# Patient Record
Sex: Male | Born: 1990 | Race: White | Hispanic: No | Marital: Married | Smoking: Never smoker
Health system: Southern US, Community
[De-identification: ages and names within clinical notes are randomized; demographics above are authoritative.]

## PROBLEM LIST (undated history)

## (undated) DIAGNOSIS — J45909 Unspecified asthma, uncomplicated: Secondary | ICD-10-CM

---

## 2020-12-28 ENCOUNTER — Other Ambulatory Visit: Payer: Self-pay

## 2020-12-28 ENCOUNTER — Ambulatory Visit: Payer: 59 | Admitting: Gastroenterology

## 2020-12-28 ENCOUNTER — Encounter: Payer: Self-pay | Admitting: Gastroenterology

## 2020-12-28 VITALS — BP 108/71 | HR 76 | Temp 97.8°F | Ht 68.0 in | Wt 147.0 lb

## 2020-12-28 DIAGNOSIS — Z8719 Personal history of other diseases of the digestive system: Secondary | ICD-10-CM

## 2020-12-28 DIAGNOSIS — K219 Gastro-esophageal reflux disease without esophagitis: Secondary | ICD-10-CM

## 2020-12-28 DIAGNOSIS — R1310 Dysphagia, unspecified: Secondary | ICD-10-CM

## 2020-12-28 MED ORDER — OMEPRAZOLE 40 MG PO CPDR: 40.0000 mg | DELAYED_RELEASE_CAPSULE | Freq: Two times a day (BID) | ORAL | 3 refills | Status: DC

## 2020-12-28 NOTE — Progress Notes (Signed)
Wyline Mood MD, MRCP(U.K) [address]  [address]  [place], [place] [address]  Main: [phone]  Fax: [phone]   Gastroenterology Consultation  Referring Provider:    Self    Primary Care Physician:  Pcp, No Primary Gastroenterologist:  Dr. Wyline Mood  Reason for Consultation:     GERD        HPI:   Walter Vasquez is a 30 y.o. y/o male is here today to see me for acid reflux.  He was accompanied by his wife.  He is a Public affairs consultant himself.  He states that he has had acid reflux since 2013.  In 2019 he underwent an EGD and I reviewed the report myself and it suggests he has a hiatal hernia but otherwise was normal.  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  Pictures of his last endoscopy report indicate a normal-appearing pylorus based on what I could interpret.  He does have a history suggestive of early satiety and feeling that food sits in the stomach for a long period of time.  When he lays flat he has feels food regurgitates into his throat even a few hours after his last meal which is usually around 5 PM.  Denies any exposure to opiates or marijuana.  He has been on famotidine 40 mg twice a day with breakthrough symptoms occurring a few times a week for which she takes Tums.  He also has had a history of dysphagia to solids at times.   Some weight loss partly intentional.  He uses a wedge pillow at night. No past medical history on file.    Prior to Admission medications   Not on File    No family history on file.   Social History   Tobacco Use   Smoking status: Never    Passive exposure: Never   Smokeless tobacco: Never  Substance Use Topics   Alcohol use: Yes    Comment: Rarely    Allergies as of 12/28/2020   (No Known Allergies)    Review of Systems:    All systems reviewed and negative except where noted in HPI.   Physical Exam:  BP 108/71   Pulse 76   Temp 97.8 F (36.6 C) (Oral)   Ht 5'  8" (1.727 m)   Wt 147 lb (66.7 kg)   BMI 22.35 kg/m  No LMP for male patient. Psych:  Alert and cooperative. Normal mood and affect. General:   Alert,  Well-developed, well-nourished, pleasant and cooperative in NAD Head:  Normocephalic and atraumatic. Eyes:  Sclera clear, no icterus.   Conjunctiva pink. Ears:  Normal auditory acuity. Nose:  No deformity, discharge, or lesions. Lungs:  Respirations even and unlabored.  Clear throughout to auscultation.   No wheezes, crackles, or rhonchi. No acute distress. Heart:  Regular rate and rhythm; no murmurs, clicks, rubs, or gallops. Abdomen:  Normal bowel sounds.  No bruits.  Soft, non-tender and non-distended without masses, hepatosplenomegaly or hernias noted.  No guarding or rebound tenderness.    Neurologic:  Alert and oriented x3;  grossly normal neurologically. Psych:  Alert and cooperative. Normal mood and affect.  Imaging Studies: No results found.  Assessment and Plan:   Christpher Stogsdill is a 30 y.o. 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  Presently has features suggestive of gastroparesis versus gastric outlet obstruction, acid reflux symptoms not well controlled on famotidine 40 mg twice daily.  Also some gas and bloating.  Plan 1.  Continue lifestyle changes.  Stop famotidine commence on Prilosec 40 mg twice daily to see if he would respond to maximum acid suppression. 2.  EGD to rule out eosinophilic esophagitis as well as gastric outlet obstruction and evaluate pylorus to rule out stenosis. 3.  Down the road can consider esophageal manometry, pH testing, gastric emptying study. 4.  Down the road may need to consider treatment for hiatal hernia. 5.  Low FODMAP diet to treat gas and bloating. 6.  He will update me in a week's time about his symptoms.  I have  discussed alternative options, risks & benefits,  which include, but are not limited to, bleeding, infection, perforation,respiratory complication & drug reaction.  The patient agrees with this plan & written consent will be obtained.     Follow up in 12 weeks.   Dr Wyline Mood MD,MRCP(U.K)

## 2020-12-28 NOTE — Patient Instructions (Addendum)
°Food Choices for Gastroesophageal Reflux Disease, Adult °When you have gastroesophageal reflux disease (GERD), the foods you eat and your eating habits are very important. Choosing the right foods can help ease your discomfort. Think about working with a food expert (dietitian) to help you make good choices. °What are tips for following this plan? °Reading food labels °Look for foods that are low in saturated fat. Foods that may help with your symptoms include: °Foods that have less than 5% of daily value (DV) of fat. °Foods that have 0 grams of trans fat. °Cooking °Do not fry your food. °Cook your food by baking, steaming, grilling, or broiling. These are all methods that do not need a lot of fat for cooking. °To add flavor, try to use herbs that are low in spice and acidity. °Meal planning ° °Choose healthy foods that are low in fat, such as: °Fruits and vegetables. °Whole grains. °Low-fat dairy products. °Lean meats, fish, and poultry. °Eat small meals often instead of eating 3 large meals each day. Eat your meals slowly in a place where you are relaxed. Avoid bending over or lying down until 2-3 hours after eating. °Limit high-fat foods such as fatty meats or fried foods. °Limit your intake of fatty foods, such as oils, butter, and shortening. °Avoid the following as told by your doctor: °Foods that cause symptoms. These may be different for different people. Keep a food diary to keep track of foods that cause symptoms. °Alcohol. °Drinking a lot of liquid with meals. °Eating meals during the 2-3 hours before bed. °Lifestyle °Stay at a healthy weight. Ask your doctor what weight is healthy for you. If you need to lose weight, work with your doctor to do so safely. °Exercise for at least 30 minutes on 5 or more days each week, or as told by your doctor. °Wear loose-fitting clothes. °Do not smoke or use any products that contain nicotine or tobacco. If you need help quitting, ask your doctor. °Sleep with the head  of your bed higher than your feet. Use a wedge under the mattress or blocks under the bed frame to raise the head of the bed. °Chew sugar-free gum after meals. °What foods should eat? °Eat a healthy, well-balanced diet of fruits, vegetables, whole grains, low-fat dairy products, lean meats, fish, and poultry. Each person is different. °Foods that may cause symptoms in one person may not cause any symptoms in another person. Work with your doctor to find foods that are safe for you. °The items listed above may not be a complete list of what you can eat and drink. Contact a food expert for more options. °What foods should I avoid? °Limiting some of these foods may help in managing the symptoms of GERD. Everyone is different. Talk with a food expert or your doctor to help you find the exact foods to avoid, if any. °Fruits °Any fruits prepared with added fat. Any fruits that cause symptoms. For some people, this may include citrus fruits, such as oranges, grapefruit, pineapple, and lemons. °Vegetables °Deep-fried vegetables. French fries. Any vegetables prepared with added fat. Any vegetables that cause symptoms. For some people, this may include tomatoes and tomato products, chili peppers, onions and garlic, and horseradish. °Grains °Pastries or quick breads with added fat. °Meats and other proteins °High-fat meats, such as fatty beef or pork, hot dogs, ribs, ham, sausage, salami, and bacon. Fried meat or protein, including fried fish and fried chicken. Nuts and nut butters, in large amounts. °Dairy °Whole milk   and chocolate milk. Sour cream. Cream. Ice cream. Cream cheese. Milkshakes. °Fats and oils °Butter. Margarine. Shortening. Ghee. °Beverages °Coffee and tea, with or without caffeine. Carbonated beverages. Sodas. Energy drinks. Fruit juice made with acidic fruits, such as orange or grapefruit. Tomato juice. Alcoholic drinks. °Sweets and desserts °Chocolate and cocoa. Donuts. °Seasonings and condiments °Pepper.  Peppermint and spearmint. Added salt. Any condiments, herbs, or seasonings that cause symptoms. For some people, this may include curry, hot sauce, or vinegar-based salad dressings. °The items listed above may not be a complete list of what you should not eat and drink. Contact a food expert for more options. °Questions to ask your doctor °Diet and lifestyle changes are often the first steps that are taken to manage symptoms of GERD. If diet and lifestyle changes do not help, talk with your doctor about taking medicines. °Where to find more information °International Foundation for Gastrointestinal Disorders: aboutgerd.org °Summary °When you have GERD, food and lifestyle choices are very important in easing your symptoms. °Eat small meals often instead of 3 large meals a day. Eat your meals slowly and in a place where you are relaxed. °Avoid bending over or lying down until 2-3 hours after eating. °Limit high-fat foods such as fatty meats or fried foods. °This information is not intended to replace advice given to you by your health care provider. Make sure you discuss any questions you have with your health care provider. °Document Revised: 12/01/2019 Document Reviewed: 12/01/2019 °Elsevier Patient Education © 2022 Elsevier Inc[employer] °Low-FODMAP Eating Plan °FODMAP stands for fermentable oligosaccharides, disaccharides, monosaccharides, and polyols. These are sugars that are hard for some people to digest. A low-FODMAP eating plan may help some people who have irritable bowel syndrome (IBS) and certain other bowel (intestinal) diseases to manage their symptoms. °This meal plan can be complicated to follow. Work with a diet and nutrition specialist (dietitian) to make a low-FODMAP eating plan that is right for you. A dietitian can help make sure that you get enough nutrition from this diet. °What are tips for following this plan? °Reading food labels °Check labels for hidden FODMAPs such as: °High-fructose  syrup. °Honey. °Agave. °Natural fruit flavors. °Onion or garlic powder. °Choose low-FODMAP foods that contain 3-4 grams of fiber per serving. °Check food labels for serving sizes. Eat only one serving at a time to make sure FODMAP levels stay low. °Shopping °Shop with a list of foods that are recommended on this diet and make a meal plan. °Meal planning °Follow a low-FODMAP eating plan for up to 6 weeks, or as told by your health care provider or dietitian. °To follow the eating plan: °Eliminate high-FODMAP foods from your diet completely. Choose only low-FODMAP foods to eat. You will do this for 2-6 weeks. °Gradually reintroduce high-FODMAP foods into your diet one at a time. Most people should wait a few days before introducing the next new high-FODMAP food into their meal plan. Your dietitian can recommend how quickly you may reintroduce foods. °Keep a daily record of what and how much you eat and drink. Make note of any symptoms that you have after eating. °Review your daily record with a dietitian regularly to identify which foods you can eat and which foods you should avoid. °General tips °Drink enough fluid each day to keep your urine pale yellow. °Avoid processed foods. These often have added sugar and may be high in FODMAPs. °Avoid most dairy products, whole grains, and sweeteners. °Work with a dietitian to make sure you get enough   fiber in your diet. °Avoid high FODMAP foods at meals to manage symptoms. °Recommended foods °Fruits °Bananas, oranges, tangerines, lemons, limes, blueberries, raspberries, strawberries, grapes, cantaloupe, honeydew melon, kiwi, papaya, passion fruit, and pineapple. Limited amounts of dried cranberries, banana chips, and shredded coconut. °Vegetables °Eggplant, zucchini, cucumber, peppers, green beans, bean sprouts, lettuce, arugula, kale, Swiss chard, spinach, collard greens, bok choy, summer squash, potato, and tomato. Limited amounts of corn, carrot, and sweet potato. Green  parts of scallions. °Grains °Gluten-free grains, such as rice, oats, buckwheat, quinoa, corn, polenta, and millet. Gluten-free pasta, bread, or cereal. Rice noodles. Corn tortillas. °Meats and other proteins °Unseasoned beef, pork, poultry, or fish. Eggs. Bacon. Tofu (firm) and tempeh. Limited amounts of nuts and seeds, such as almonds, walnuts, brazil nuts, pecans, peanuts, nut butters, pumpkin seeds, chia seeds, and sunflower seeds. °Dairy °Lactose-free milk, yogurt, and kefir. Lactose-free cottage cheese and ice cream. Non-dairy milks, such as almond, coconut, hemp, and rice milk. Non-dairy yogurt. Limited amounts of goat cheese, brie, mozzarella, parmesan, swiss, and other hard cheeses. °Fats and oils °Butter-free spreads. Vegetable oils, such as olive, canola, and sunflower oil. °Seasoning and other foods °Artificial sweeteners with names that do not end in "ol," such as aspartame, saccharine, and stevia. Maple syrup, white table sugar, raw sugar, brown sugar, and molasses. Mayonnaise, soy sauce, and tamari. Fresh basil, coriander, parsley, rosemary, and thyme. °Beverages °Water and mineral water. Sugar-sweetened soft drinks. Small amounts of orange juice or cranberry juice. Black and green tea. Most dry wines. Coffee. °The items listed above may not be a complete list of foods and beverages you can eat. Contact a dietitian for more information. °Foods to avoid °Fruits °Fresh, dried, and juiced forms of apple, pear, watermelon, peach, plum, cherries, apricots, blackberries, boysenberries, figs, nectarines, and mango. Avocado. °Vegetables °Chicory root, artichoke, asparagus, cabbage, snow peas, Brussels sprouts, broccoli, sugar snap peas, mushrooms, celery, and cauliflower. Onions, garlic, leeks, and the white part of scallions. °Grains °Wheat, including kamut, durum, and semolina. Barley and bulgur. Couscous. Wheat-based cereals. Wheat noodles, bread, crackers, and pastries. °Meats and other proteins °Fried or  fatty meat. Sausage. Cashews and pistachios. Soybeans, baked beans, black beans, chickpeas, kidney beans, fava beans, navy beans, lentils, black-eyed peas, and split peas. °Dairy °Milk, yogurt, ice cream, and soft cheese. Cream and sour cream. Milk-based sauces. Custard. Buttermilk. Soy milk. °Seasoning and other foods °Any sugar-free gum or candy. Foods that contain artificial sweeteners such as sorbitol, mannitol, isomalt, or xylitol. Foods that contain honey, high-fructose corn syrup, or agave. Bouillon, vegetable stock, beef stock, and chicken stock. Garlic and onion powder. Condiments made with onion, such as hummus, chutney, pickles, relish, salad dressing, and salsa. Tomato paste. °Beverages °Chicory-based drinks. Coffee substitutes. Chamomile tea. Fennel tea. Sweet or fortified wines such as port or sherry. Diet soft drinks made with isomalt, mannitol, maltitol, sorbitol, or xylitol. Apple, pear, and mango juice. Juices with high-fructose corn syrup. °The items listed above may not be a complete list of foods and beverages you should avoid. Contact a dietitian for more information. °Summary °FODMAP stands for fermentable oligosaccharides, disaccharides, monosaccharides, and polyols. These are sugars that are hard for some people to digest. °A low-FODMAP eating plan is a short-term diet that helps to ease symptoms of certain bowel diseases. °The eating plan usually lasts up to 6 weeks. After that, high-FODMAP foods are reintroduced gradually and one at a time. This can help you find out which foods may be causing symptoms. °A low-FODMAP eating plan can be complicated.   It is best to work with a dietitian who has experience with this type of plan. °This information is not intended to replace advice given to you by your health care provider. Make sure you discuss any questions you have with your health care provider. °Document Revised: 10/09/2019 Document Reviewed: 10/09/2019 °Elsevier Patient Education © 2022  Elsevier Inc. ° °

## 2021-01-04 ENCOUNTER — Encounter: Payer: Self-pay | Admitting: Gastroenterology

## 2021-01-04 ENCOUNTER — Ambulatory Visit
Admission: RE | Admit: 2021-01-04 | Discharge: 2021-01-04 | Disposition: A | Discharge status: Home / Self Care | Payer: 59 | Attending: Gastroenterology | Admitting: Gastroenterology

## 2021-01-04 ENCOUNTER — Ambulatory Visit: Payer: 59 | Admitting: Anesthesiology

## 2021-01-04 ENCOUNTER — Encounter
Admission: RE | Disposition: A | Discharge status: Home / Self Care | Payer: Self-pay | Source: Home / Self Care | Attending: Gastroenterology

## 2021-01-04 ENCOUNTER — Other Ambulatory Visit: Payer: Self-pay

## 2021-01-04 DIAGNOSIS — K219 Gastro-esophageal reflux disease without esophagitis: Secondary | ICD-10-CM | POA: Insufficient documentation

## 2021-01-04 DIAGNOSIS — Z79899 Other long term (current) drug therapy: Secondary | ICD-10-CM | POA: Diagnosis not present

## 2021-01-04 DIAGNOSIS — R1310 Dysphagia, unspecified: Secondary | ICD-10-CM | POA: Diagnosis not present

## 2021-01-04 HISTORY — PX: ESOPHAGOGASTRODUODENOSCOPY (EGD) WITH PROPOFOL: SHX5813

## 2021-01-04 SURGERY — ESOPHAGOGASTRODUODENOSCOPY (EGD) WITH PROPOFOL
Anesthesia: General

## 2021-01-04 MED ORDER — PROPOFOL 10 MG/ML IV BOLUS
INTRAVENOUS | Status: DC | PRN
  Administered 2021-01-04: 80 mg via INTRAVENOUS
  Administered 2021-01-04: 20 mg via INTRAVENOUS

## 2021-01-04 MED ORDER — SODIUM CHLORIDE 0.9 % IV SOLN: INTRAVENOUS | Status: DC

## 2021-01-04 MED ORDER — PHENYLEPHRINE HCL (PRESSORS) 10 MG/ML IV SOLN
INTRAVENOUS | Status: AC
  Filled 2021-01-04: qty 1

## 2021-01-04 MED ORDER — PROPOFOL 500 MG/50ML IV EMUL
INTRAVENOUS | Status: DC | PRN
  Administered 2021-01-04: 200 ug/kg/min via INTRAVENOUS

## 2021-01-04 MED ORDER — PROPOFOL 500 MG/50ML IV EMUL
INTRAVENOUS | Status: AC
  Filled 2021-01-04: qty 50

## 2021-01-04 NOTE — Transfer of Care (Signed)
Immediate Anesthesia Transfer of Care Note  Patient: Walter Vasquez  Procedure(s) Performed: ESOPHAGOGASTRODUODENOSCOPY (EGD) WITH PROPOFOL  Patient Location: PACU  Anesthesia Type:General  Level of Consciousness: sedated  Airway & Oxygen Therapy: Patient Spontanous Breathing and Patient connected to nasal cannula oxygen  Post-op Assessment: Report given to RN and Post -op Vital signs reviewed and stable  Post vital signs: Reviewed and stable  Last Vitals:  Vitals Value Taken Time  BP 98/67 01/04/21 0802  Temp    Pulse 72 01/04/21 0802  Resp 20 01/04/21 0802  SpO2 99 % 01/04/21 0802  Vitals shown include unvalidated device data.  Last Pain:  Vitals:   01/04/21 0654  TempSrc: Temporal  PainSc: 0-No pain         Complications: No notable events documented.

## 2021-01-04 NOTE — Op Note (Signed)
[facility] Gastroenterology Patient Name: Walter Vasquez Procedure Date: 01/04/2021 7:01 AM MRN: [id number] Account #: [id number] Date of Birth: 08-Jul-1990 Admit Type: Outpatient Age: 30 Room: [facility] ENDO ROOM 2 Gender: Male Note Status: Finalized Procedure:             Upper GI endoscopy Indications:           Heartburn, Suspected gastro-esophageal reflux disease Providers:             Wyline Mood MD, MD Referring MD:          No Local Md, MD (Referring MD) Medicines:             Monitored Anesthesia Care Complications:         No immediate complications. Procedure:             Pre-Anesthesia Assessment:                        - Prior to the procedure, a History and Physical was                         performed, and patient medications, allergies and                         sensitivities were reviewed. The patient's tolerance                         of previous anesthesia was reviewed.                        - The risks and benefits of the procedure and the                         sedation options and risks were discussed with the                         patient. All questions were answered and informed                         consent was obtained.                        - ASA Grade Assessment: II - A patient with mild                         systemic disease.                        After obtaining informed consent, the endoscope was                         passed under direct vision. Throughout the procedure,                         the patient's blood pressure, pulse, and oxygen                         saturations were monitored continuously. The Endoscope                         was introduced through  the mouth, and advanced to the                         third part of duodenum. The upper GI endoscopy was                         accomplished with ease. The patient tolerated the                         procedure well. Findings:      The examined esophagus was  normal. Biopsies were taken with a cold       forceps for histology.      The entire examined stomach was normal.      The cardia and gastric fundus were normal on retroflexion.      The examined duodenum was normal. Impression:            - Normal esophagus. Biopsied.                        - Normal stomach. Recommendation:        - Discharge patient to home (with escort).                        - Resume previous diet.                        - Continue present medications.                        - Await pathology results.                        - Return to my office as previously scheduled. Procedure Code(s):     --- Professional ---                        [phone], Esophagogastroduodenoscopy, flexible,                         transoral; with biopsy, single or multiple Diagnosis Code(s):     --- Professional ---                        R12, Heartburn CPT copyright 2019 American Medical Association. All rights reserved. The codes documented in this report are preliminary and upon coder review may  be revised to meet current compliance requirements. Wyline Mood, MD Wyline Mood MD, MD 01/04/2021 7:58:56 AM This report has been signed electronically. Number of Addenda: 0 Note Initiated On: 01/04/2021 7:01 AM Estimated Blood Loss:  Estimated blood loss: none. Estimated blood loss: none.      [facility]

## 2021-01-04 NOTE — H&P (Signed)
     Wyline Mood, MD [address], [place], [place], [address] [address], [address], [place], [place], [address] Phone: [phone]  Fax: [phone]  Primary Care Physician:  Pcp, No   Pre-Procedure History & Physical: HPI:  Wilson Dusenbery is a 30 y.o. male is here for an endoscopy    History reviewed. No pertinent past medical history.  History reviewed. No pertinent surgical history.  Prior to Admission medications   Medication Sig Start Date End Date Taking? Authorizing Provider  omeprazole (PRILOSEC) 40 MG capsule Take 1 capsule (40 mg total) by mouth in the morning and at bedtime. 12/28/20  Yes Wyline Mood, MD  famotidine (PEPCID) 40 MG tablet Take 40 mg by mouth 2 (two) times daily.    [provider]    Allergies as of 12/28/2020   (No Known Allergies)    History reviewed. No pertinent family history.  Social History   Socioeconomic History   Marital status: Married    Spouse name: Not on file   Number of children: Not on file   Years of education: Not on file   Highest education level: Not on file  Occupational History   Not on file  Tobacco Use   Smoking status: Never    Passive exposure: Never   Smokeless tobacco: Never  Vaping Use   Vaping Use: Never used  Substance and Sexual Activity   Alcohol use: Yes    Comment: Rarely   Drug use: Never   Sexual activity: Not on file  Other Topics Concern   Not on file  Social History Narrative   Not on file   Social Determinants of Health   Financial Resource Strain: Not on file  Food Insecurity: Not on file  Transportation Needs: Not on file  Physical Activity: Not on file  Stress: Not on file  Social Connections: Not on file  Intimate Partner Violence: Not on file    Review of Systems: See HPI, otherwise negative ROS  Physical Exam: BP 106/74   Pulse 69   Temp (!) 97.5 F (36.4 C) (Temporal)   Resp 17   Ht 5\' 8"  (1.727 m)   Wt 65.8 kg   SpO2 100%   BMI 22.05 kg/m   General:   Alert,  pleasant and cooperative in NAD Head:  Normocephalic and atraumatic. Neck:  Supple; no masses or thyromegaly. Lungs:  Clear throughout to auscultation, normal respiratory effort.    Heart:  +S1, +S2, Regular rate and rhythm, No edema. Abdomen:  Soft, nontender and nondistended. Normal bowel sounds, without guarding, and without rebound.   Neurologic:  Alert and  oriented x4;  grossly normal neurologically.  Impression/Plan: Mako Pelfrey is here for an endoscopy  to be performed for  evaluation of GERD    Risks, benefits, limitations, and alternatives regarding endoscopy have been reviewed with the patient.  Questions have been answered.  All parties agreeable.   Areta Haber, MD  01/04/2021, 7:46 AM GERD

## 2021-01-04 NOTE — Anesthesia Postprocedure Evaluation (Signed)
Anesthesia Post Note  Patient: Walter Vasquez  Procedure(s) Performed: ESOPHAGOGASTRODUODENOSCOPY (EGD) WITH PROPOFOL  Patient location during evaluation: Endoscopy Anesthesia Type: General Level of consciousness: awake and alert Pain management: pain level controlled Vital Signs Assessment: post-procedure vital signs reviewed and stable Respiratory status: spontaneous breathing, nonlabored ventilation, respiratory function stable and patient connected to nasal cannula oxygen Cardiovascular status: blood pressure returned to baseline and stable Postop Assessment: no apparent nausea or vomiting Anesthetic complications: no   No notable events documented.   Last Vitals:  Vitals:   01/04/21 0812 01/04/21 0822  BP: 107/79 106/76  Pulse: 73 67  Resp: 13 14  Temp:    SpO2: 100% 100%    Last Pain:  Vitals:   01/04/21 0822  TempSrc:   PainSc: 0-No pain                 Lenard Simmer

## 2021-01-04 NOTE — Anesthesia Preprocedure Evaluation (Signed)
Anesthesia Evaluation  Patient identified by MRN, date of birth, ID band Patient awake    Reviewed: Allergy & Precautions, H&P , NPO status , Patient's Chart, lab work & pertinent test results, reviewed documented beta blocker date and time   History of Anesthesia Complications Negative for: history of anesthetic complications  Airway Mallampati: II  TM Distance: >3 FB Neck ROM: full  Mouth opening: Limited Mouth Opening  Dental  (+) Dental Advidsory Given, Teeth Intact Permanent retainer on the bottom:   Pulmonary neg shortness of breath, asthma (as a child) , neg COPD, neg recent URI,    Pulmonary exam normal breath sounds clear to auscultation       Cardiovascular Exercise Tolerance: Good negative cardio ROS Normal cardiovascular exam Rhythm:regular Rate:Normal     Neuro/Psych negative neurological ROS  negative psych ROS   GI/Hepatic Neg liver ROS, GERD  ,  Endo/Other  negative endocrine ROS  Renal/GU negative Renal ROS  negative genitourinary   Musculoskeletal   Abdominal   Peds  Hematology negative hematology ROS (+)   Anesthesia Other Findings History reviewed. No pertinent past medical history.   Reproductive/Obstetrics negative OB ROS                             Anesthesia Physical Anesthesia Plan  ASA: 2  Anesthesia Plan: General   Post-op Pain Management:    Induction: Intravenous  PONV Risk Score and Plan: 2 and TIVA and Propofol infusion  Airway Management Planned: Natural Airway and Nasal Cannula  Additional Equipment:   Intra-op Plan:   Post-operative Plan:   Informed Consent: I have reviewed the patients History and Physical, chart, labs and discussed the procedure including the risks, benefits and alternatives for the proposed anesthesia with the patient or authorized representative who has indicated his/her understanding and acceptance.     Dental  Advisory Given  Plan Discussed with: Anesthesiologist, CRNA and Surgeon  Anesthesia Plan Comments:         Anesthesia Quick Evaluation

## 2021-01-05 ENCOUNTER — Encounter: Payer: Self-pay | Admitting: Gastroenterology

## 2021-01-05 LAB — SURGICAL PATHOLOGY

## 2021-01-12 ENCOUNTER — Ambulatory Visit: Payer: Self-pay | Admitting: Gastroenterology

## 2021-02-11 ENCOUNTER — Other Ambulatory Visit: Payer: Self-pay

## 2021-02-11 MED ORDER — OMEPRAZOLE 20 MG PO CPDR: 20.0000 mg | DELAYED_RELEASE_CAPSULE | Freq: Two times a day (BID) | ORAL | 3 refills | Status: DC

## 2021-02-17 ENCOUNTER — Telehealth: Payer: Self-pay | Admitting: Gastroenterology

## 2021-02-17 NOTE — Telephone Encounter (Signed)
Pt. Calling about a mix up with his prescription sent

## 2021-02-17 NOTE — Telephone Encounter (Signed)
Patient stated that he had called his pharmacy and it's all taken care of. He had no further questions.

## 2021-03-22 ENCOUNTER — Telehealth (INDEPENDENT_AMBULATORY_CARE_PROVIDER_SITE_OTHER): Payer: 59 | Admitting: Gastroenterology

## 2021-03-22 DIAGNOSIS — K219 Gastro-esophageal reflux disease without esophagitis: Secondary | ICD-10-CM | POA: Diagnosis not present

## 2021-03-22 NOTE — Progress Notes (Signed)
Wyline Mood , MD [address]  [address]  [place], [place] [address]  Main: [phone]  Fax: [phone]   Primary Care Physician: Pcp, No  Virtual Visit via Video Note  I connected with patient on 03/22/21 at  3:00 PM EDT by video and verified that I am speaking with the correct person using two identifiers.   I discussed the limitations, risks, security and privacy concerns of performing an evaluation and management service by video  and the availability of in person appointments. I also discussed with the patient that there may be a patient responsible charge related to this service. The patient expressed understanding and agreed to proceed.  Location of Patient: Home Location of Provider: Home Persons involved: Patient and provider only   History of Present Illness:   Chief complaint: Follow-up for GERD   HPI: Walter Vasquez is a 30 y.o. male   Summary of history :  Initially referred and seen on 7 11/28/2020 for GERD.He is a Public affairs consultant himself.  He states that he has had acid reflux since 2013.  In 2019 he underwent an EGD and I reviewed the report myself and it suggests he has a hiatal hernia but otherwise was normal.  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  Pictures of his last endoscopy report indicate a normal-appearing pylorus based on what I could interpret.  He does have a history suggestive of early satiety and feeling that food sits in the stomach for a long period of time.  When he lays flat he has feels food regurgitates into his throat even a few hours after his last meal which is usually around 5 PM.  Denies any exposure to opiates or marijuana.  He has been on famotidine 40 mg twice a day with breakthrough symptoms occurring a few times a week for which she takes Tums.  He also has had a history of dysphagia to solids at times.     Some weight loss partly intentional.  He uses a wedge pillow at  night. No past medical history on file.    Interval history   12/28/2020-03/22/2021  01/04/2021: EGD: Normal esophagus, stomach and duodenum.  Biopsies of the esophagus showed no abnormalities. A week later he updated me on his progress.  Since commencing on omeprazole his motility issues and late-night reflux symptoms significantly improved.  We subsequently increase it from 20 mg once a day to 20 mg twice daily.  He definitely feels better on 20 mg twice a day but despite that sometimes has 2-4 episodes a week.  He would like to try higher dose.  He is interested to discuss about antireflux surgery.  No other symptoms presently  Current Outpatient Medications  Medication Sig Dispense Refill   omeprazole (PRILOSEC) 20 MG capsule Take 1 capsule (20 mg total) by mouth 2 (two) times daily before a meal. 180 capsule 3   famotidine (PEPCID) 40 MG tablet Take 40 mg by mouth 2 (two) times daily.     No current facility-administered medications for this visit.    Allergies as of 03/22/2021   (No Known Allergies)    Review of Systems:    All systems reviewed and negative except where noted in HPI.  General Appearance:    Alert, cooperative, no distress, appears stated age  Head:    Normocephalic, without obvious abnormality, atraumatic  Eyes:    PERRL, conjunctiva/corneas clear,  Ears:  Grossly normal hearing    Neurologic:  Grossly normal    Observations/Objective:  Labs: CMP  No results found for: NA, K, CL, CO2, GLUCOSE, BUN, CREATININE, CALCIUM, PROT, ALBUMIN, AST, ALT, ALKPHOS, BILITOT, GFRNONAA, GFRAA No results found for: WBC, HGB, HCT, MCV, PLT  Imaging Studies: No results found.  Assessment and Plan:   Walter Vasquez is a 30 y.o. 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  Initial visit showed that he was having symptoms despite  being on Pepcid 40 mg a day and also some gas and bloating.  Plan 1.  Presently symptoms are significantly improved after changing from Pepcid to omeprazole 20 mg twice daily.  Still has residual symptoms.  Asked him to try increasing the dose to 40 mg twice daily for a few weeks.  If has complete resolution of symptoms then it would very strongly correlate with acid reflux.  I also discussed about long term management which would include antireflux surgery.  He is interested.  I will refer him to [facility] for further evaluation.  He will contact me via MyChart if he requires anything else     I discussed the assessment and treatment plan with the patient. The patient was provided an opportunity to ask questions and all were answered. The patient agreed with the plan and demonstrated an understanding of the instructions.   The patient was advised to call back or seek an in-person evaluation if the symptoms worsen or if the condition fails to improve as anticipated.  I provided 18 minutes of face-to-face time during this encounter.  Dr Wyline Mood MD,MRCP [facility]) Gastroenterology/Hepatology Pager: [phone]   Speech recognition software was used to dictate this note.

## 2021-03-22 NOTE — Addendum Note (Signed)
Addended by: Linward Foster on: 03/22/2021 03:53 PM   Modules accepted: Orders

## 2021-04-21 ENCOUNTER — Encounter: Payer: Self-pay | Admitting: Gastroenterology

## 2021-04-25 MED ORDER — OMEPRAZOLE 10 MG PO CPDR: 10.0000 mg | DELAYED_RELEASE_CAPSULE | Freq: Every day | ORAL | 3 refills | Status: AC

## 2021-05-02 ENCOUNTER — Other Ambulatory Visit: Payer: Self-pay

## 2021-05-02 DIAGNOSIS — K219 Gastro-esophageal reflux disease without esophagitis: Secondary | ICD-10-CM

## 2021-05-27 ENCOUNTER — Other Ambulatory Visit: Payer: Self-pay

## 2021-05-27 ENCOUNTER — Encounter (HOSPITAL_COMMUNITY): Payer: Self-pay

## 2021-05-27 ENCOUNTER — Ambulatory Visit (HOSPITAL_COMMUNITY)
Admission: RE | Admit: 2021-05-27 | Discharge: 2021-05-27 | Disposition: A | Discharge status: Home / Self Care | Payer: 59 | Source: Ambulatory Visit | Attending: Gastroenterology | Admitting: Gastroenterology

## 2021-05-27 DIAGNOSIS — K219 Gastro-esophageal reflux disease without esophagitis: Secondary | ICD-10-CM | POA: Diagnosis present

## 2021-05-27 HISTORY — DX: Unspecified asthma, uncomplicated: J45.909

## 2021-05-27 IMAGING — NM NM GASTRIC EMPTYING
8 series · 8 of 8 positions shown · non-contrast
Comparison: None.

CLINICAL DATA: History of reflux

EXAM:
NUCLEAR MEDICINE GASTRIC EMPTYING SCAN
TECHNIQUE: After oral ingestion of radiolabeled meal, sequential abdominal
images were obtained for 4 hours. Percentage of activity emptying
the stomach was calculated at 1 hour, 2 hour and 3 hours.
RADIOPHARMACEUTICALS:  2.1 mCi [TG] sulfur colloid in standardized
meal

[Series 1: 0 min · 4.14mm/px · 1 of 1 slices shown (1 of 2)]
[im 1/1]
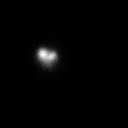

[Series 1: 0 min · 4.14mm/px · 1 of 1 slices shown (2 of 2)]
[im 1/1]
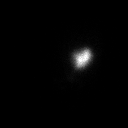

[Series 2: 60 min · 4.14mm/px · 1 of 1 slices shown (1 of 2)]
[im 1/1]
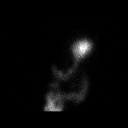

[Series 2: 60 min · 4.14mm/px · 1 of 1 slices shown (2 of 2)]
[im 1/1]
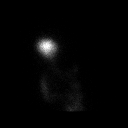

[Series 3: 120 min · 4.14mm/px · 1 of 1 slices shown (1 of 2)]
[im 1/1]
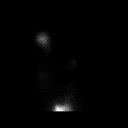

[Series 3: 120 min · 4.14mm/px · 1 of 1 slices shown (2 of 2)]
[im 1/1]
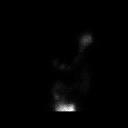

[Series 4: 180 min · 4.14mm/px · 1 of 1 slices shown (1 of 2)]
[im 1/1]
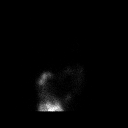

[Series 4: 180 min · 4.14mm/px · 1 of 1 slices shown (2 of 2)]
[im 1/1]
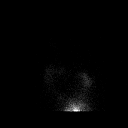

[8 of 8 positions shown; findings below may reference images not displayed]

FINDINGS: Expected location of the stomach in the left upper quadrant.
Ingested meal empties the stomach gradually over the course of the
study.

42% emptied at 1 hr ( normal >= 10%)

85% emptied at 2 hr ( normal >= 40%)

98% emptied at 3 hr ( normal >= 70%)
IMPRESSION: Normal gastric emptying study.

## 2021-05-27 MED ORDER — TECHNETIUM TC 99M SULFUR COLLOID
2.0000 | Freq: Once | INTRAVENOUS | Status: AC | PRN
  Administered 2021-05-27: 08:00:00 2.1 via ORAL

## 2021-05-31 ENCOUNTER — Encounter: Payer: Self-pay | Admitting: Gastroenterology

## 2022-08-14 ENCOUNTER — Other Ambulatory Visit: Payer: Self-pay | Admitting: Internal Medicine

## 2022-08-14 DIAGNOSIS — R7989 Other specified abnormal findings of blood chemistry: Secondary | ICD-10-CM

## 2022-08-21 ENCOUNTER — Ambulatory Visit
Admission: RE | Admit: 2022-08-21 | Discharge: 2022-08-21 | Disposition: A | Discharge status: Home / Self Care | Payer: 59 | Source: Ambulatory Visit | Attending: Internal Medicine | Admitting: Internal Medicine

## 2022-08-21 DIAGNOSIS — R7989 Other specified abnormal findings of blood chemistry: Secondary | ICD-10-CM | POA: Diagnosis present

## 2023-12-05 ENCOUNTER — Ambulatory Visit: Admitting: Urology

## 2023-12-05 VITALS — BP 99/67 | HR 80 | Ht 69.0 in | Wt 150.0 lb

## 2023-12-05 DIAGNOSIS — Z3009 Encounter for other general counseling and advice on contraception: Secondary | ICD-10-CM

## 2023-12-05 NOTE — Patient Instructions (Signed)
 Pre-Vasectomy Instructions ? ?STOP all aspirin or blood thinners (Aspirin, Plavix, Coumadin, Warfarin, Motrin, Ibuprofen, Advil, Aleve, Naproxen, Naprosyn) for 7 days prior to the procedure.  If you have any questions about stopping these medications please contact your primary care physician or cardiologist. ? ?Shave all hair from the upper scrotum on the day of the procedure.  This means just under the penis onto the scrotal sac.  The area shaved should measure about 2-3 inches around.  You may lather the scrotum with soap and water, and shave with a safety razor. ? ?After shaving the area, thoroughly wash the penis and the scrotum, then shower or bathe to remove all the loose hairs.  If needed, wash the area again just before coming in for your Vasectomy. ? ?It is recommended to have a light meal an hour or so prior to the procedure. ? ?Bring a scrotal support (jock strap or suspensory, or tight jockey shorts or underwear).  Wear comfortable pants or shorts. ? ?While the actual procedure usually takes about 45 minutes, you should be prepared to stay in the office for approximately one hour.  Bring someone with you to drive you home. ? ?If you have any questions or concerns, please feel free to call the office at [phone]. ?  ?

## 2023-12-07 ENCOUNTER — Encounter: Payer: Self-pay | Admitting: Urology

## 2023-12-07 NOTE — Progress Notes (Signed)
 12/05/2023 2:17 PM   Jayson Garfield 1990/10/10 [id number]  Referring provider: Sherial Bail, MD [address] [place],  [place] [address]  Chief Complaint  Patient presents with   VAS Consult    HPI: Kahari Critzer is a 33 y.o. male who presents for vasectomy counseling.  Married without children; he and his wife do not desire children Denies prior history urologic problems including chronic scrotal pain, epididymitis or orchitis No previous history inguinal hernia or pelvic surgery No history of bleeding or clotting disorders   PMH: Past Medical History:  Diagnosis Date   Asthma     Surgical History: Past Surgical History:  Procedure Laterality Date   ESOPHAGOGASTRODUODENOSCOPY (EGD) WITH PROPOFOL  N/A 01/04/2021   Procedure: ESOPHAGOGASTRODUODENOSCOPY (EGD) WITH PROPOFOL ;  Surgeon: Therisa Bi, MD;  Location: [facility] ENDOSCOPY;  Service: Gastroenterology;  Laterality: N/A;  1ST CASE    Home Medications:  Allergies as of 12/05/2023   No Known Allergies      Medication List        Accurate as of December 05, 2023 11:59 PM. If you have any questions, ask your nurse or doctor.          STOP taking these medications    famotidine 40 MG tablet Commonly known as: PEPCID Stopped by: Glendia JAYSON Barba       TAKE these medications    omeprazole  10 MG capsule Commonly known as: PRILOSEC Take 1 capsule (10 mg total) by mouth daily.        Allergies: No Known Allergies  Family History: No family history on file.  Social History:  reports that he has never smoked. He has never been exposed to tobacco smoke. He has never used smokeless tobacco. He reports current alcohol use. He reports that he does not use drugs.   Physical Exam: BP 99/67   Pulse 80   Ht 5' 9 (1.753 m)   Wt 150 lb (68 kg)   BMI 22.15 kg/m   Constitutional:  Alert and oriented, No acute distress. HEENT: [place] AT Respiratory: Normal respiratory effort, no increased work of  breathing. GU: Phallus without lesions, testes descended bilaterally without masses or tenderness, spermatic cord/epididymis palpably normal bilaterally.  Vasa palpable bilaterally Psychiatric: Normal mood and affect.   Assessment & Plan:    1.  Undesired fertility Desires to schedule vasectomy We had a long discussion about vasectomy. We specifically discussed the procedure, recovery and the risks, benefits and alternatives of vasectomy. I explained that the procedure entails removal of a segment of each vas deferens, each of which conducts sperm, and that the purpose of this procedure is to cause sterility (inability to produce children or cause pregnancy). Vasectomy is intended to be permanent and irreversible form of contraception. Options for fertility after vasectomy include vasectomy reversal, or sperm retrieval with in vitro fertilization. These options are not always successful, and they may be expensive. We discussed the option of freezing sperm in a sperm bank prior to the vasectomy procedure. We discussed the importance of avoiding strenuous exercise for four days after vasectomy, and the importance of refraining from any form of ejaculation for seven days after vasectomy. I explained that vasectomy does not produce immediate sterility so another form of contraceptive must be used until sterility is assured by having semen checked for sperm. Thus, a post vasectomy semen analysis is necessary to confirm sterility. Rarely, vasectomy must be repeated. We discussed the approximately 1 in 2,000 risk of pregnancy after vasectomy for men who have  post-vasectomy semen analysis showing absent sperm or rare non-motile sperm. Typical side effects include a small amount of oozing blood, some discomfort and mild swelling in the area of incision.  Vasectomy does not affect sexual performance, function, please, sensation, interest, desire, satisfaction, penile erection, volume of semen or ejaculation. Other  rare risks include allergy or adverse reaction to an anesthetic, testicular atrophy, hematoma, infection/abscess, prolonged tenderness of the vas deferens, pain, swelling, painful nodule or scar (called sperm granuloma) or epididymtis. We discussed chronic testicular pain syndrome. This has been reported to occur in as many as 1-2% of men and may be permanent. This can be treated with medication, small procedures or (rarely) surgery. He indicated he would call back if he desires a preprocedure anxiolytic and would need a driver if utilizing   Glendia JAYSON Barba, MD  [facility] [address], [address] [place], [place] [address] [phone]

## 2024-03-14 ENCOUNTER — Encounter: Admitting: Urology

## 2024-04-08 ENCOUNTER — Ambulatory Visit: Admitting: Urology

## 2024-04-08 ENCOUNTER — Encounter: Payer: Self-pay | Admitting: Urology

## 2024-04-08 VITALS — BP 110/72 | HR 76 | Ht 70.0 in | Wt 150.0 lb

## 2024-04-08 DIAGNOSIS — Z302 Encounter for sterilization: Secondary | ICD-10-CM | POA: Diagnosis not present

## 2024-04-08 DIAGNOSIS — Z9852 Vasectomy status: Secondary | ICD-10-CM

## 2024-04-08 NOTE — Patient Instructions (Signed)

## 2024-04-08 NOTE — Progress Notes (Signed)
   Vasectomy Procedure Note  Indications: Walter Vasquez is a 33 y.o. male who presents today for elective sterilization.  He has been consented for the procedure.  He is aware of the risks and benefits.  He had no additional questions.  He agrees to proceed.  He denies any other significant change since his last visit.  Pre-operative Diagnosis: Elective sterilization  Post-operative Diagnosis: Elective sterilization  Premedication: N/A  Surgeon: Glendia C. Laray Rivkin, M.D  Description: The patient was prepped and draped in the standard fashion.  The right vas deferens was identified and brought superiorly to the anterior scrotal skin.  The skin and vas were then anesthetized utilizing 4 ml 1% lidocaine.  A small stab incision was made and spread with the vas dissector.  The vas was grasped utilizing the vas clamp and elevated out of the incision. The vas sheath was incised and the vas was dissected out of the sheath, elevated and dissected free from the surrounding tissue and vessels.  A 1 cm segment was excised. The vas lumens were cauterized with a needle tip electrocautery for a distance of at least 1 cm.  Fascial interposition was performed on the testicular end with a figure-of-eight 3-0 chromic suture.   No significant bleeding was observed.  The vas ends were then dropped back into the hemiscrotum.  The skin was closed with hemostatic pressure.  An identical procedure was performed on the contralateral side.  Clean dry gauze was applied to the incision sites.  The patient tolerated the procedure well.  Complications:None  Recommendations: 1.  No lifting greater than 10 pounds or strenuous activity for 1 week. 2.  Scrotal support for 1-2 weeks. 3.  May shower in 24 hours; no bath, hot tub for 1 week 4.  No intercourse for at least 7 days and resume based on level of discomfort  5.  Continue alternate contraception for 12 weeks.  6.  Call for significant pain, swelling, redness, drainage or  fever greater than 100.5. 7.  Acetaminophen/ibuprofen as needed pain 8.  Follow-up semen analysis in 12 weeks.   Glendia Barba, MD

## 2024-07-09 ENCOUNTER — Other Ambulatory Visit

## 2024-07-09 DIAGNOSIS — Z9852 Vasectomy status: Secondary | ICD-10-CM

## 2024-07-10 LAB — POST-VAS SPERM EVALUATION,QUAL: Volume: 2.8 mL

## 2024-07-11 ENCOUNTER — Ambulatory Visit: Payer: Self-pay | Admitting: Urology
